# Patient Record
Sex: Male | Born: 1980 | Race: Black or African American | Hispanic: No | Marital: Single | Smoking: Current every day smoker
Health system: Southern US, Community
[De-identification: ages and names within clinical notes are randomized; demographics above are authoritative.]

## PROBLEM LIST (undated history)

## (undated) DIAGNOSIS — S2249XA Multiple fractures of ribs, unspecified side, initial encounter for closed fracture: Secondary | ICD-10-CM

## (undated) DIAGNOSIS — S0230XA Fracture of orbital floor, unspecified side, initial encounter for closed fracture: Secondary | ICD-10-CM

## (undated) DIAGNOSIS — S022XXA Fracture of nasal bones, initial encounter for closed fracture: Secondary | ICD-10-CM

## (undated) DIAGNOSIS — R51 Headache: Secondary | ICD-10-CM

## (undated) DIAGNOSIS — R519 Headache, unspecified: Secondary | ICD-10-CM

## (undated) HISTORY — PX: NO PAST SURGERIES: SHX2092

---

## 2007-01-22 ENCOUNTER — Emergency Department (HOSPITAL_COMMUNITY): Admission: EM | Admit: 2007-01-22 | Discharge: 2007-01-22 | Payer: Self-pay | Admitting: Emergency Medicine

## 2007-02-28 ENCOUNTER — Emergency Department (HOSPITAL_COMMUNITY): Admission: EM | Admit: 2007-02-28 | Discharge: 2007-02-28 | Payer: Self-pay | Admitting: Emergency Medicine

## 2014-08-24 ENCOUNTER — Emergency Department (HOSPITAL_COMMUNITY)
Admission: EM | Admit: 2014-08-24 | Discharge: 2014-08-24 | Disposition: A | Discharge status: Home / Self Care | Payer: Self-pay | Attending: Emergency Medicine | Admitting: Emergency Medicine

## 2014-08-24 ENCOUNTER — Encounter (HOSPITAL_COMMUNITY): Payer: Self-pay | Admitting: Emergency Medicine

## 2014-08-24 DIAGNOSIS — S01112A Laceration without foreign body of left eyelid and periocular area, initial encounter: Secondary | ICD-10-CM | POA: Insufficient documentation

## 2014-08-24 DIAGNOSIS — W01198A Fall on same level from slipping, tripping and stumbling with subsequent striking against other object, initial encounter: Secondary | ICD-10-CM | POA: Insufficient documentation

## 2014-08-24 DIAGNOSIS — Y9389 Activity, other specified: Secondary | ICD-10-CM | POA: Insufficient documentation

## 2014-08-24 DIAGNOSIS — S0181XA Laceration without foreign body of other part of head, initial encounter: Secondary | ICD-10-CM

## 2014-08-24 DIAGNOSIS — Z23 Encounter for immunization: Secondary | ICD-10-CM | POA: Insufficient documentation

## 2014-08-24 DIAGNOSIS — Y92481 Parking lot as the place of occurrence of the external cause: Secondary | ICD-10-CM | POA: Insufficient documentation

## 2014-08-24 DIAGNOSIS — S0081XA Abrasion of other part of head, initial encounter: Secondary | ICD-10-CM

## 2014-08-24 DIAGNOSIS — W19XXXA Unspecified fall, initial encounter: Secondary | ICD-10-CM

## 2014-08-24 MED ORDER — BACITRACIN 500 UNIT/GM EX OINT
1.0000 "application " | TOPICAL_OINTMENT | Freq: Two times a day (BID) | CUTANEOUS | Status: DC
  Filled 2014-08-24 (×2): qty 0.9

## 2014-08-24 MED ORDER — LIDOCAINE HCL (PF) 1 % IJ SOLN
5.0000 mL | Freq: Once | INTRAMUSCULAR | Status: AC
  Administered 2014-08-24: 5 mL via INTRADERMAL
  Filled 2014-08-24: qty 5

## 2014-08-24 MED ORDER — TETANUS-DIPHTH-ACELL PERTUSSIS 5-2.5-18.5 LF-MCG/0.5 IM SUSP
0.5000 mL | Freq: Once | INTRAMUSCULAR | Status: AC
  Administered 2014-08-24: 0.5 mL via INTRAMUSCULAR
  Filled 2014-08-24: qty 0.5

## 2014-08-24 NOTE — ED Provider Notes (Signed)
CSN: [id number]     Arrival date & time 08/24/14  1638 History   First MD Initiated Contact with Patient 08/24/14 1724     Chief Complaint  Patient presents with  . Head Laceration     (Consider location/radiation/quality/duration/timing/severity/associated sxs/prior Treatment) HPI  Vincent Booth is a 33 y.o. male complaining of laceration above left eye status post slip and fall. Patient states he was in a parking lot, playing with his friends son and he tripped and fell hitting his head on the concrete. There was no loss of consciousness. He rates his pain is minimal. Last tetanus shot is unknown. Bleeding is controlled. He denies any change in vision, pain with eye movement.  History reviewed. No pertinent past medical history. History reviewed. No pertinent past surgical history. History reviewed. No pertinent family history. History  Substance Use Topics  . Smoking status: Never Smoker   . Smokeless tobacco: Not on file  . Alcohol Use: Yes    Review of Systems  10 systems reviewed and found to be negative, except as noted in the HPI.   Allergies  Review of patient's allergies indicates no known allergies.  Home Medications   Prior to Admission medications   Not on File   BP 140/74  Pulse 100  Temp(Src) 98.2 F (36.8 C) (Oral)  Resp 16  Ht 6\' 1"  (1.854 m)  Wt 215 lb (97.523 kg)  BMI 28.37 kg/m2  SpO2 100% Physical Exam  Nursing note and vitals reviewed. Constitutional: He is oriented to person, place, and time. He appears well-developed and well-nourished.  HENT:  Head: Normocephalic and atraumatic.    Mouth/Throat: Oropharynx is clear and moist.  No tenderness palpation or crepitance along the orbital rim. Extraocular movements areintact without pain or diplopia  Eyes: Conjunctivae and EOM are normal. Pupils are equal, round, and reactive to light.  Neck: Normal range of motion. Neck supple.  No midline C-spine  tenderness to palpation or step-offs  appreciated. Patient has full range of motion without pain.   Cardiovascular: Normal rate, regular rhythm and intact distal pulses.   Pulmonary/Chest: Effort normal and breath sounds normal. No respiratory distress. He has no wheezes. He has no rales. He exhibits no tenderness.  No TTP or crepitance  Abdominal: Soft. Bowel sounds are normal. He exhibits no distension and no mass. There is no tenderness. There is no rebound and no guarding.  No  Musculoskeletal: Normal range of motion. He exhibits no edema and no tenderness.  Pelvis stable. No deformity or TTP of major joints.   Good ROM  Neurological: He is alert and oriented to person, place, and time.  Strength 5/5 x4 extremities   Distal sensation intact  Skin: Skin is warm.  Psychiatric: He has a normal mood and affect.    ED Course  LACERATION REPAIR Date/Time: 08/24/2014 7:57 PM Performed by: Wynetta EmeryPISCIOTTA, Ivy Meriwether Authorized by: Wynetta EmeryPISCIOTTA, Lorna Strother Consent: Verbal consent obtained. Consent given by: patient Patient identity confirmed: verbally with patient Body area: head/neck Location details: left eyelid Laceration length: 2 cm Foreign bodies: no foreign bodies Tendon involvement: none Vascular damage: no Anesthesia: local infiltration Local anesthetic: lidocaine 1% without epinephrine Anesthetic total: 2 ml Patient sedated: no Preparation: Patient was prepped and draped in the usual sterile fashion. Irrigation solution: saline Irrigation method: syringe Amount of cleaning: extensive Wound skin closure material used: 6-0 ethylon. Number of sutures: 5 Technique: simple Approximation: close Approximation difficulty: simple Dressing: antibiotic ointment and 4x4 sterile gauze Patient tolerance: Patient tolerated the procedure  well with no immediate complications.    LACERATION REPAIR Date/Time: 08/24/2014 7:57 PM Performed by: Wynetta EmeryPISCIOTTA, Manna Gose Authorized by: Wynetta EmeryPISCIOTTA, Zayah Keilman Consent: Verbal consent  obtained. Consent given by: patient Patient identity confirmed: verbally with patient Body area: head/neck Location details: left eyelid Laceration length: 0.5 cm Foreign bodies: no foreign bodies Tendon involvement: none Vascular damage: no Anesthesia: local infiltration Local anesthetic: lidocaine 1% without epinephrine Anesthetic total: 2 ml Patient sedated: no Preparation: Patient was prepped and draped in the usual sterile fashion. Irrigation solution: saline Irrigation method: syringe Amount of cleaning: extensive Wound skin closure material used: 6-0 ethylon. Number of sutures:2 Technique: simple Approximation: close Approximation difficulty: simple Dressing: antibiotic ointment and 4x4 sterile gauze Patient tolerance: Patient tolerated the procedure well with no immediate complications.   Labs Review Labs Reviewed - No data to display  Imaging Review No results found.   EKG Interpretation None      MDM   Final diagnoses:  Facial laceration, initial encounter  Facial abrasion, initial encounter  Fall, initial encounter    Filed Vitals:   08/24/14 1724  BP: 140/74  Pulse: 100  Temp: 98.2 F (36.8 C)  TempSrc: Oral  Resp: 16  Height: 6\' 1"  (1.854 m)  Weight: 215 lb (97.523 kg)  SpO2: 100%    Medications  bacitracin ointment 1 application (not administered)  Tdap (BOOSTRIX) injection 0.5 mL (0.5 mLs Intramuscular Given 08/24/14 1734)  lidocaine (PF) (XYLOCAINE) 1 % injection 5 mL (5 mLs Intradermal Given 08/24/14 1734)    Vincent Booth is a 33 y.o. male presenting with abrasions and left periorbital lacerations, status post slip and fall from standing onto the concrete pavement. He did not lose consciousness, neuro exam is nonfocal. No indication for intracranial imaging. Patient has no tenderness to palpation or crepitance along the orbital rim, full extraocular movement without pain or diplopia, no indication for maxillofacial imaging.  Wounds  are irrigated irrigated and close, tetanus updated. Wound care discussed.  Evaluation does not show pathology that would require ongoing emergent intervention or inpatient treatment. Pt is hemodynamically stable and mentating appropriately. Discussed findings and plan with patient/guardian, who agrees with care plan. All questions answered. Return precautions discussed and outpatient follow up given.       Wynetta Emeryicole Bailen Geffre, PA-C 08/24/14 2000

## 2014-08-24 NOTE — ED Notes (Signed)
Laceration above rt. Eye.  Playing around with friends son. Pt. Fell from the curb. No loc.

## 2014-08-24 NOTE — Discharge Instructions (Signed)
Keep wound dry and do not remove dressing for 24 hours if possible. After that, wash gently morning and night (every 12 hours) with soap and water. Use a topical antibiotic ointment and cover with a bandaid or gauze.    Do NOT use rubbing alcohol or hydrogen peroxide, do not soak the area   Present to your primary care doctor or the urgent care of your choice, or the ED for suture removal in 7 days.   Every attempt was made to remove foreign body (contaminants) from the wound.  However, there is always a chance that some may remain in the wound. This can  increase your risk of infection.   If you see signs of infection (warmth, redness, tenderness, pus, sharp increase in pain, fever, red streaking in the skin) immediately return to the emergency department.   After the wound heals fully, apply sunscreen for 6-12 months to minimize scarring.     Facial Laceration  A facial laceration is a cut on the face. These injuries can be painful and cause bleeding. Lacerations usually heal quickly, but they need special care to reduce scarring. DIAGNOSIS  Your health care provider will take a medical history, ask for details about how the injury occurred, and examine the wound to determine how deep the cut is. TREATMENT  Some facial lacerations may not require closure. Others may not be able to be closed because of an increased risk of infection. The risk of infection and the chance for successful closure will depend on various factors, including the amount of time since the injury occurred. The wound may be cleaned to help prevent infection. If closure is appropriate, pain medicines may be given if needed. Your health care provider will use stitches (sutures), wound glue (adhesive), or skin adhesive strips to repair the laceration. These tools bring the skin edges together to allow for faster healing and a better cosmetic outcome. If needed, you may also be given a tetanus shot. HOME CARE  INSTRUCTIONS  Only take over-the-counter or prescription medicines as directed by your health care provider.  Follow your health care provider's instructions for wound care. These instructions will vary depending on the technique used for closing the wound. For Sutures:  Keep the wound clean and dry.   If you were given a bandage (dressing), you should change it at least once a day. Also change the dressing if it becomes wet or dirty, or as directed by your health care provider.   Wash the wound with soap and water 2 times a day. Rinse the wound off with water to remove all soap. Pat the wound dry with a clean towel.   After cleaning, apply a thin layer of the antibiotic ointment recommended by your health care provider. This will help prevent infection and keep the dressing from sticking.   You may shower as usual after the first 24 hours. Do not soak the wound in water until the sutures are removed.   Get your sutures removed as directed by your health care provider. With facial lacerations, sutures should usually be taken out after 4-5 days to avoid stitch marks.   Wait a few days after your sutures are removed before applying any makeup. For Skin Adhesive Strips:  Keep the wound clean and dry.   Do not get the skin adhesive strips wet. You may bathe carefully, using caution to keep the wound dry.   If the wound gets wet, pat it dry with a clean towel.  Skin adhesive strips will fall off on their own. You may trim the strips as the wound heals. Do not remove skin adhesive strips that are still stuck to the wound. They will fall off in time.  For Wound Adhesive:  You may briefly wet your wound in the shower or bath. Do not soak or scrub the wound. Do not swim. Avoid periods of heavy sweating until the skin adhesive has fallen off on its own. After showering or bathing, gently pat the wound dry with a clean towel.   Do not apply liquid medicine, cream medicine, ointment  medicine, or makeup to your wound while the skin adhesive is in place. This may loosen the film before your wound is healed.   If a dressing is placed over the wound, be careful not to apply tape directly over the skin adhesive. This may cause the adhesive to be pulled off before the wound is healed.   Avoid prolonged exposure to sunlight or tanning lamps while the skin adhesive is in place.  The skin adhesive will usually remain in place for 5-10 days, then naturally fall off the skin. Do not pick at the adhesive film.  After Healing: Once the wound has healed, cover the wound with sunscreen during the day for 1 full year. This can help minimize scarring. Exposure to ultraviolet light in the first year will darken the scar. It can take 1-2 years for the scar to lose its redness and to heal completely.  SEEK IMMEDIATE MEDICAL CARE IF:  You have redness, pain, or swelling around the wound.   You see ayellowish-white fluid (pus) coming from the wound.   You have chills or a fever.  MAKE SURE YOU:  Understand these instructions.  Will watch your condition.  Will get help right away if you are not doing well or get worse. Document Released: 12/07/2004 Document Revised: 08/20/2013 Document Reviewed: 06/12/2013 [facility] Patient Information 2015 [place], [place]. This information is not intended to replace advice given to you by your health care provider. Make sure you discuss any questions you have with your health care provider.  Abrasion An abrasion is a cut or scrape of the skin. Abrasions do not extend through all layers of the skin and most heal within 10 days. It is important to care for your abrasion properly to prevent infection. CAUSES  Most abrasions are caused by falling on, or gliding across, the ground or other surface. When your skin rubs on something, the outer and inner layer of skin rubs off, causing an abrasion. DIAGNOSIS  Your caregiver will be able to diagnose an  abrasion during a physical exam.  TREATMENT  Your treatment depends on how large and deep the abrasion is. Generally, your abrasion will be cleaned with water and a mild soap to remove any dirt or debris. An antibiotic ointment may be put over the abrasion to prevent an infection. A bandage (dressing) may be wrapped around the abrasion to keep it from getting dirty.  You may need a tetanus shot if:  You cannot remember when you had your last tetanus shot.  You have never had a tetanus shot.  The injury broke your skin. If you get a tetanus shot, your arm may swell, get red, and feel warm to the touch. This is common and not a problem. If you need a tetanus shot and you choose not to have one, there is a rare chance of getting tetanus. Sickness from tetanus can be serious.  HOME  CARE INSTRUCTIONS   If a dressing was applied, change it at least once a day or as directed by your caregiver. If the bandage sticks, soak it off with warm water.   Wash the area with water and a mild soap to remove all the ointment 2 times a day. Rinse off the soap and pat the area dry with a clean towel.   Reapply any ointment as directed by your caregiver. This will help prevent infection and keep the bandage from sticking. Use gauze over the wound and under the dressing to help keep the bandage from sticking.   Change your dressing right away if it becomes wet or dirty.   Only take over-the-counter or prescription medicines for pain, discomfort, or fever as directed by your caregiver.   Follow up with your caregiver within 24-48 hours for a wound check, or as directed. If you were not given a wound-check appointment, look closely at your abrasion for redness, swelling, or pus. These are signs of infection. SEEK IMMEDIATE MEDICAL CARE IF:   You have increasing pain in the wound.   You have redness, swelling, or tenderness around the wound.   You have pus coming from the wound.   You have a fever or  persistent symptoms for more than 2-3 days.  You have a fever and your symptoms suddenly get worse.  You have a bad smell coming from the wound or dressing.  MAKE SURE YOU:   Understand these instructions.  Will watch your condition.  Will get help right away if you are not doing well or get worse. Document Released: 08/09/2005 Document Revised: 10/16/2012 Document Reviewed: 10/03/2011 [facility] Patient Information 2015 [place], [place]. This information is not intended to replace advice given to you by your health care provider. Make sure you discuss any questions you have with your health care provider. Emergency Department Resource Guide 1) Find a Doctor and Pay Out of Pocket Although you won't have to find out who is covered by your insurance plan, it is a good idea to ask around and get recommendations. You will then need to call the office and see if the doctor you have chosen will accept you as a new patient and what types of options they offer for patients who are self-pay. Some doctors offer discounts or will set up payment plans for their patients who do not have insurance, but you will need to ask so you aren't surprised when you get to your appointment.  2) Contact Your Local Health Department Not all health departments have doctors that can see patients for sick visits, but many do, so it is worth a call to see if yours does. If you don't know where your local health department is, you can check in your phone book. The CDC also has a tool to help you locate your state's health department, and many state websites also have listings of all of their local health departments.  3) Find a Walk-in Clinic If your illness is not likely to be very severe or complicated, you may want to try a walk in clinic. These are popping up all over the country in pharmacies, drugstores, and shopping centers. They're usually staffed by nurse practitioners or physician assistants that have been trained to  treat common illnesses and complaints. They're usually fairly quick and inexpensive. However, if you have serious medical issues or chronic medical problems, these are probably not your best option.  No Primary Care Doctor: - Call Health Connect at  [phone] - they can help you locate a primary care doctor that  accepts your insurance, provides certain services, etc. - Physician Referral Service- [phone]  Chronic Pain Problems: Organization         Address  Phone   Notes  [facility]  [phone]) [phone] Patients need to be referred by their primary care doctor.   Medication Assistance: Organization         Address  Phone   Notes  [facility]ication [facility] [address] [place], [place] [address] [phone]) [phone] --Must be a resident of [facility] [place] -- Must have NO insurance coverage whatsoever (no Medicaid/ Medicare, etc.) -- The pt. MUST have a primary care doctor that directs their care regularly and follows them in the community   Med[facility]ist  [phone]   [employer]  [phone]    Agencies that provide inexpensive medical care: Organization         Address  Phone   Notes  [facility]  [phone]   [facility]    [phone]) [phone]   [facility] [address] [place], [place] [address] [phone]) [phone]   [facility] [place] [address], [place] [phone]   [facility]anned Parenthood    [phone]   [facility]    [phone]) [phone]   [facility]  [address], [place] Phone:  [phone], Fax:  [phone] Hours of Operation:  9 am - 6 pm, M-F.  Also accepts Medicaid/Medicare and self-pay.  [facility]  [address], [place] Phone: [phone]) [phone], Fax: [phone]) [phone]. Hours of Operation:  8:30 am - 5:30 pm, M-F.  Also accepts Medicaid and self-pay.  [facility]  [place] [address], [place] Phone: [phone]   [facility] [address], [place], [place] [phone], Ext. [phone] Mondays & Thursdays: 7-9 AM.  First 15 patients are seen on a first come, first serve basis.    Medicaid-accepting [facility] Providers:  Organization         Address  Phone   Notes  [facility] [address], [place] [phone] Also accepts self-pay patients.  [facility] [address], [place]  [phone]) [phone]   [facility] [address], [place] [phone]) [phone]   [facility] [address] [place] [address], [place] [phone]) [phone]   Renaye RakersVeita Bland [address], [place]   [phone] Only accepts [place] Access [place] patients after they have their name applied to their card.   Self-Pay (no insurance) in [facility] [place]:  Organization         Address  Phone   Notes  Sickle Cell Patients, [facility] [address], [place] [phone]) [phone]   [facility] [address], [place] [phone]   [facility]  [address], [place] [phone]   [facility]/Dr. Osei-Bonsu  [address] [place] [address], [place] or [phone], [place] [phone] Phone number for both [place] and [place] locations is the same.  [facility] [address], [place] [phone]) [phone]   [facility] [place] [address] [place] [address], [place] or  [address] [phone]   [facility] [address] [place] [phone], phone; [phone], fax Sees patients 1st and 3rd Saturday of every month.  Must not qualify for public or private insurance (i.e. Medicaid, Medicare, [place] Health Choice, Veterans' Benefits)  Household income should be no more than 200%  of the poverty level The clinic cannot treat you if you are pregnant or think you are pregnant  Sexually transmitted diseases are not treated at the clinic.    Dental Care: Organization         Address  Phone  Notes  [facility] of [facility] [address] [place], [place] [phone] Accepts children up to age 78 who are enrolled in [place] or [place] Health Choice; pregnant women with a Medicaid card; and children who have applied for Medicaid or [place] Health Choice, but were declined, whose parents can pay a reduced fee at time of service.  [facility]  [address], [place] [phone] Accepts children up to age 12 who are enrolled in [place] or [place] Health Choice; pregnant women with a Medicaid card; and children who have applied for Medicaid or [place] Health Choice, but were declined, whose parents can pay a reduced fee at time of service.  [facility]  [address] [place], [place] [phone] Patients are seen by appointment only. Walk-ins are not accepted. [facility] will see patients 32 years of age and older. Monday - Tuesday (8am-5pm) Most Wednesdays (8:30-5pm) $30 per visit, cash only  [facility]  [address], [facility] [phone] Patients are seen by appointment only. Walk-ins are not accepted. [facility] will see patients 73 years of age and older. One Wednesday Evening (Monthly: Volunteer Based).  $30 per visit, cash only  [employer]  [phone] for adults; Children under age 29, call Graduate Pediatric Dentistry at [phone]. Children aged 91-14, please call [phone] to request a pediatric application.  Dental services are provided in all areas of dental care including fillings, crowns and bridges, complete and partial dentures, implants, gum treatment, root canals, and  extractions. Preventive care is also provided. Treatment is provided to both adults and children. Patients are selected via a lottery and there is often a waiting list.   [facility] [address] [place]  [phone] [email or web address]   [facility] [address] [place], [place] [phone], Ext. [phone] Second and Fourth Thursday of each month, opens at 6:30 AM; Clinic ends at 9 AM.  Patients are seen on a first-come first-served basis, and a limited number are seen during each clinic.   [facility]  [address] [place], [place] [phone]   Eligibility Requirements You must have lived in [place], [place], or [place] counties for at least the last three months.   You cannot be eligible for state or federal sponsored [place], including [employer], [place], or [employer].   You generally cannot be eligible for healthcare insurance through your employer.    How to apply: Eligibility screenings are held every Tuesday and Wednesday afternoon from 1:00 pm until 4:00 pm. You do not need an appointment for the interview!  [facility] [address] [place], [place] [phone]   [facility]  [phone]   [email or web address]  [employer] Health Department  [phone]   [facility] Department  [phone]    Behavioral Health Resources in the Community: Intensive Outpatient Programs Organization         Address  Phone  Notes  [facility] Services [address], [place], [place] [phone]   [facility] [address], [place], [place] [phone]   [facility] [address], [place], [place]  [phone]   [facility] [address],  [place], [place] [phone] or [phone]   Substance Abuse Resources Organization         Address  Phone  Notes  Alcohol [facility] Drug Services  [phone]    [facility]  [phone]   [facility] [place]  [phone]   Floydene Flock  [phone]   Residential & Outpatient Substance Abuse Program  [phone]   Psychological Services Organization         Address  Phone  Notes  [facility] Behavior[facility] Health  [phone]   [facility] Services  [phone]   [facility] [address], [place] [phone] or [phone]    Mobile Crisis Teams Organization         Address  Phone  Notes  [facility], Mobile Crisis Care Unit  [phone]   [facility]  [address] [place], [place] [phone]   Doristine Locks [address] [place] [phone]    Self-Help/Support Groups Organization         Address  Phone             Notes  [facility]ental [facility] Assoc. of [place] - variety of support groups  [phone]- I7437963 Call for more information  Narcotics Anonymous (NA), [facility]aring [facility] [address], [employer] [place]  2 meetings at this location   Statistician         Address  Phone  Notes  [facility] [address],    [place]  [phone]   [facility]  [address] [place] 6[address]195, [place], [place] [phone]   [facility] [address] [place], [place] [phone] Admissions: 8am-3pm M-F  [facility] [address],    [place], [place] [phone]   [facility] [address] [place], [place], [place] [phone]   [facility] [address]  [place], [place] [phone]   [facility] - Intensive Outpatient [address], [place], [place] [phone]   [facility] (Addiction Recovery [facility] [address] [place]  [place], [place] [phone] or [phone]   [facility] ([facility]) [address] [place], [place] [phone] Accepts Medicaid  [facility] [place] [address]   [place] [phone] Substance Abuse/Addiction Treatment   [facility] Organization         Address  Phone  Notes  [facility]  [phone]   Angie Fava, PhD [address] [place], [place]   [phone] or [phone]   [facility] Behavioral   [address] [place], [place] [phone]   [facility] [address], [place], [place] [phone] Insurance/Medicaid/sponsorship through [employer] and Families [address]  [place], [place] [phone] [place], [place] [phone]    Dr. Adele Schilder  [phone]   [facility] [place] Dept. 1) [address], [place] 2) [place] 3)  [place] [address], [place] [phone]  [phone]   [place] [phone] or [phone] (After Hours)

## 2014-08-24 NOTE — ED Provider Notes (Signed)
Medical screening examination/treatment/procedure(s) were performed by non-physician practitioner and as supervising physician I was immediately available for consultation/collaboration.   EKG Interpretation None        Gwyneth SproutWhitney Cranston Koors, MD 08/24/14 2315

## 2014-08-31 ENCOUNTER — Emergency Department (HOSPITAL_COMMUNITY)
Admission: EM | Admit: 2014-08-31 | Discharge: 2014-08-31 | Disposition: A | Discharge status: Home / Self Care | Payer: Self-pay | Attending: Emergency Medicine | Admitting: Emergency Medicine

## 2014-08-31 ENCOUNTER — Encounter (HOSPITAL_COMMUNITY): Payer: Self-pay | Admitting: Emergency Medicine

## 2014-08-31 DIAGNOSIS — Z4802 Encounter for removal of sutures: Secondary | ICD-10-CM | POA: Insufficient documentation

## 2014-08-31 NOTE — Discharge Planning (Signed)
[facility] Community Health & Eligibility Specialist  Spoke to patient regarding follow up care and primary care resources. Resource guide and my contact information provided for any future questions or concerns. No other Community Health & Eligibility Specialist needs identified at this time.

## 2014-08-31 NOTE — ED Notes (Signed)
Pager [phone]

## 2014-08-31 NOTE — ED Notes (Signed)
Pt here for suture removal to right eye area

## 2014-08-31 NOTE — Discharge Instructions (Signed)

## 2014-08-31 NOTE — ED Provider Notes (Signed)
CSN: [id number]     Arrival date & time 08/31/14  1301 History  This chart was scribed for non-physician practitioner, Fayrene HelperBowie Mikel Hardgrove, PA-C, working with Samuel JesterKathleen McManus, DO by Charline BillsEssence Howell, ED Scribe. This patient was seen in room TR04C/TR04C and the patient's care was started at 2:06 PM.   Chief Complaint  Patient presents with  . Suture / Staple Removal   The history is provided by the patient. No language interpreter was used.   HPI Comments: Vincent ItoDavid Booth is a 33 y.o. male who presents to the Emergency Department for suture removal. Pt had 7 sutures placed above R eye following a fall on 08/24/14. He denies any complications at this time. Pain has improved, no vision changes, no signs of infection.  History reviewed. No pertinent past medical history. History reviewed. No pertinent past surgical history. History reviewed. No pertinent family history. History  Substance Use Topics  . Smoking status: Never Smoker   . Smokeless tobacco: Not on file  . Alcohol Use: Yes    Review of Systems  Eyes: Negative for visual disturbance.  Skin: Positive for wound.   Allergies  Review of patient's allergies indicates no known allergies.  Home Medications   Prior to Admission medications   Not on File   Triage Vitals: BP 146/94  Pulse 96  Temp(Src) 97.7 F (36.5 C) (Oral)  Resp 18  SpO2 100% Physical Exam  Nursing note and vitals reviewed. Constitutional: He is oriented to person, place, and time. He appears well-developed and well-nourished. No distress.  HENT:  Head: Normocephalic and atraumatic.  R forehead: Well healing laceration with sutures intact  Minimal tenderness to palpation Mild surrounding edema R eye with subconjunctival hemorrhage Skin abrasion to R temp  Eyes: Conjunctivae and EOM are normal.  Neck: Neck supple. No tracheal deviation present.  Pulmonary/Chest: Effort normal. No respiratory distress.  Musculoskeletal: Normal range of motion.  Neurological:  He is alert and oriented to person, place, and time.  Skin: Skin is warm and dry.  Psychiatric: He has a normal mood and affect. His behavior is normal.   ED Course  Procedures (including critical care time) DIAGNOSTIC STUDIES: Oxygen Saturation is 100% on RA, normal by my interpretation.    COORDINATION OF CARE: 2:09 PM- Sutures were removed. Discussed wound care and OTC scar reducing cream with pt at bedside and pt agreed to plan.   SUTURE REMOVAL Performed by: Fayrene HelperRAN,Omeed Osuna  Consent: Verbal consent obtained. Patient identity confirmed: provided demographic data Time out: Immediately prior to procedure a "time out" was called to verify the correct patient, procedure, equipment, support staff and site/side marked as required.  Location details: R forehead  Wound Appearance: clean  Sutures/Staples Removed: 6  Facility: sutures placed in this facility Patient tolerance: Patient tolerated the procedure well with no immediate complications.     Labs Review Labs Reviewed - No data to display  Imaging Review No results found.   EKG Interpretation None      MDM   Final diagnoses:  Encounter for removal of sutures    BP 146/94  Pulse 96  Temp(Src) 97.7 F (36.5 C) (Oral)  Resp 18  SpO2 100%   I personally performed the services described in this documentation, which was scribed in my presence. The recorded information has been reviewed and is accurate.    Fayrene HelperBowie Rajon Bisig, PA-C 08/31/14 1423

## 2014-09-01 NOTE — ED Provider Notes (Signed)
Medical screening examination/treatment/procedure(s) were performed by non-physician practitioner and as supervising physician I was immediately available for consultation/collaboration.   EKG Interpretation None        Linzie Boursiquot, DO 09/01/14 2314 

## 2017-05-21 ENCOUNTER — Emergency Department (HOSPITAL_COMMUNITY): Payer: Self-pay

## 2017-05-21 ENCOUNTER — Observation Stay (HOSPITAL_COMMUNITY)
Admission: EM | Admit: 2017-05-21 | Discharge: 2017-05-23 | Disposition: A | Discharge status: Home / Self Care | Payer: Self-pay | Attending: General Surgery | Admitting: General Surgery

## 2017-05-21 ENCOUNTER — Encounter (HOSPITAL_COMMUNITY): Payer: Self-pay | Admitting: Vascular Surgery

## 2017-05-21 DIAGNOSIS — J342 Deviated nasal septum: Secondary | ICD-10-CM | POA: Insufficient documentation

## 2017-05-21 DIAGNOSIS — S27329A Contusion of lung, unspecified, initial encounter: Secondary | ICD-10-CM

## 2017-05-21 DIAGNOSIS — S0181XA Laceration without foreign body of other part of head, initial encounter: Secondary | ICD-10-CM | POA: Insufficient documentation

## 2017-05-21 DIAGNOSIS — S0230XA Fracture of orbital floor, unspecified side, initial encounter for closed fracture: Secondary | ICD-10-CM

## 2017-05-21 DIAGNOSIS — H53149 Visual discomfort, unspecified: Secondary | ICD-10-CM

## 2017-05-21 DIAGNOSIS — Z79899 Other long term (current) drug therapy: Secondary | ICD-10-CM | POA: Insufficient documentation

## 2017-05-21 DIAGNOSIS — S0232XA Fracture of orbital floor, left side, initial encounter for closed fracture: Principal | ICD-10-CM | POA: Insufficient documentation

## 2017-05-21 DIAGNOSIS — S2249XA Multiple fractures of ribs, unspecified side, initial encounter for closed fracture: Secondary | ICD-10-CM

## 2017-05-21 DIAGNOSIS — R42 Dizziness and giddiness: Secondary | ICD-10-CM | POA: Insufficient documentation

## 2017-05-21 DIAGNOSIS — Y939 Activity, unspecified: Secondary | ICD-10-CM | POA: Insufficient documentation

## 2017-05-21 DIAGNOSIS — F17290 Nicotine dependence, other tobacco product, uncomplicated: Secondary | ICD-10-CM | POA: Insufficient documentation

## 2017-05-21 DIAGNOSIS — M25552 Pain in left hip: Secondary | ICD-10-CM | POA: Insufficient documentation

## 2017-05-21 DIAGNOSIS — S2241XA Multiple fractures of ribs, right side, initial encounter for closed fracture: Secondary | ICD-10-CM | POA: Insufficient documentation

## 2017-05-21 DIAGNOSIS — S27321A Contusion of lung, unilateral, initial encounter: Secondary | ICD-10-CM | POA: Insufficient documentation

## 2017-05-21 DIAGNOSIS — S0240DA Maxillary fracture, left side, initial encounter for closed fracture: Secondary | ICD-10-CM | POA: Insufficient documentation

## 2017-05-21 DIAGNOSIS — S022XXA Fracture of nasal bones, initial encounter for closed fracture: Secondary | ICD-10-CM | POA: Insufficient documentation

## 2017-05-21 DIAGNOSIS — Y92410 Unspecified street and highway as the place of occurrence of the external cause: Secondary | ICD-10-CM | POA: Insufficient documentation

## 2017-05-21 HISTORY — DX: Headache: R51

## 2017-05-21 HISTORY — DX: Multiple fractures of ribs, unspecified side, initial encounter for closed fracture: S22.49XA

## 2017-05-21 HISTORY — DX: Fracture of orbital floor, unspecified side, initial encounter for closed fracture: S02.30XA

## 2017-05-21 HISTORY — DX: Fracture of nasal bones, initial encounter for closed fracture: S02.2XXA

## 2017-05-21 HISTORY — DX: Headache, unspecified: R51.9

## 2017-05-21 LAB — COMPREHENSIVE METABOLIC PANEL
ALBUMIN: 3.8 g/dL (ref 3.5–5.0)
ALT: 23 U/L (ref 17–63)
AST: 40 U/L (ref 15–41)
Alkaline Phosphatase: 45 U/L (ref 38–126)
Anion gap: 6 (ref 5–15)
BUN: 11 mg/dL (ref 6–20)
CHLORIDE: 106 mmol/L (ref 101–111)
CO2: 26 mmol/L (ref 22–32)
Calcium: 8.7 mg/dL — ABNORMAL LOW (ref 8.9–10.3)
Creatinine, Ser: 1.25 mg/dL — ABNORMAL HIGH (ref 0.61–1.24)
GFR calc Af Amer: >60 mL/min (ref >60)
Glucose, Bld: 113 mg/dL — ABNORMAL HIGH (ref 65–99)
POTASSIUM: 3.9 mmol/L (ref 3.5–5.1)
SODIUM: 138 mmol/L (ref 135–145)
Total Bilirubin: 0.9 mg/dL (ref 0.3–1.2)
Total Protein: 6.1 g/dL — ABNORMAL LOW (ref 6.5–8.1)

## 2017-05-21 LAB — CBC WITH DIFFERENTIAL/PLATELET
BASOS ABS: 0 10*3/uL (ref 0.0–0.1)
BASOS PCT: 1 %
EOS ABS: 0.1 10*3/uL (ref 0.0–0.7)
EOS PCT: 1 %
HCT: 49.2 % (ref 39.0–52.0)
Hemoglobin: 17.5 g/dL — ABNORMAL HIGH (ref 13.0–17.0)
Lymphocytes Relative: 22 %
Lymphs Abs: 1.5 10*3/uL (ref 0.7–4.0)
MCH: 31.6 pg (ref 26.0–34.0)
MCHC: 35.6 g/dL (ref 30.0–36.0)
MCV: 89 fL (ref 78.0–100.0)
MONO ABS: 0.7 10*3/uL (ref 0.1–1.0)
Monocytes Relative: 11 %
Neutro Abs: 4.4 10*3/uL (ref 1.7–7.7)
Neutrophils Relative %: 65 %
PLATELETS: 150 10*3/uL (ref 150–400)
RBC: 5.53 MIL/uL (ref 4.22–5.81)
RDW: 13.5 % (ref 11.5–15.5)
WBC: 6.6 10*3/uL (ref 4.0–10.5)

## 2017-05-21 LAB — I-STAT CHEM 8, ED
BUN: 12 mg/dL (ref 6–20)
CALCIUM ION: 1.13 mmol/L — AB (ref 1.15–1.40)
CHLORIDE: 102 mmol/L (ref 101–111)
Creatinine, Ser: 1.1 mg/dL (ref 0.61–1.24)
Glucose, Bld: 112 mg/dL — ABNORMAL HIGH (ref 65–99)
HEMATOCRIT: 51 % (ref 39.0–52.0)
Hemoglobin: 17.3 g/dL — ABNORMAL HIGH (ref 13.0–17.0)
Potassium: 3.8 mmol/L (ref 3.5–5.1)
SODIUM: 140 mmol/L (ref 135–145)
TCO2: 27 mmol/L (ref 0–100)

## 2017-05-21 LAB — SAMPLE TO BLOOD BANK

## 2017-05-21 LAB — I-STAT CG4 LACTIC ACID, ED: Lactic Acid, Venous: 1.39 mmol/L (ref 0.5–1.9)

## 2017-05-21 LAB — I-STAT TROPONIN, ED: Troponin i, poc: 0 ng/mL (ref 0.00–0.08)

## 2017-05-21 MED ORDER — SODIUM CHLORIDE 0.9 % IV SOLN
INTRAVENOUS | Status: DC
  Administered 2017-05-21 – 2017-05-22 (×2): via INTRAVENOUS

## 2017-05-21 MED ORDER — ACETAMINOPHEN 500 MG PO TABS
1000.0000 mg | ORAL_TABLET | Freq: Once | ORAL | Status: AC
  Administered 2017-05-21: 1000 mg via ORAL
  Filled 2017-05-21: qty 2

## 2017-05-21 MED ORDER — LIDOCAINE-EPINEPHRINE (PF) 2 %-1:200000 IJ SOLN
10.0000 mL | Freq: Once | INTRAMUSCULAR | Status: AC
  Administered 2017-05-21: 10 mL via INTRADERMAL
  Filled 2017-05-21: qty 20

## 2017-05-21 MED ORDER — HYDRALAZINE HCL 20 MG/ML IJ SOLN: 10.0000 mg | INTRAMUSCULAR | Status: DC | PRN

## 2017-05-21 MED ORDER — LIDOCAINE HCL (PF) 1 % IJ SOLN
2.0000 mL | Freq: Once | INTRAMUSCULAR | Status: AC
  Administered 2017-05-21: 2 mL via INTRADERMAL
  Filled 2017-05-21: qty 5

## 2017-05-21 MED ORDER — MORPHINE SULFATE (PF) 4 MG/ML IV SOLN: 2.0000 mg | INTRAVENOUS | Status: DC | PRN

## 2017-05-21 MED ORDER — IBUPROFEN 400 MG PO TABS
400.0000 mg | ORAL_TABLET | Freq: Once | ORAL | Status: AC
  Administered 2017-05-21: 400 mg via ORAL
  Filled 2017-05-21: qty 1

## 2017-05-21 MED ORDER — ONDANSETRON 4 MG PO TBDP: 4.0000 mg | ORAL_TABLET | Freq: Four times a day (QID) | ORAL | Status: DC | PRN

## 2017-05-21 MED ORDER — PANTOPRAZOLE SODIUM 40 MG IV SOLR
40.0000 mg | Freq: Every day | INTRAVENOUS | Status: DC
  Administered 2017-05-21: 40 mg via INTRAVENOUS
  Filled 2017-05-21: qty 40

## 2017-05-21 MED ORDER — CYCLOBENZAPRINE HCL 10 MG PO TABS
10.0000 mg | ORAL_TABLET | Freq: Once | ORAL | Status: AC
  Administered 2017-05-21: 10 mg via ORAL
  Filled 2017-05-21: qty 1

## 2017-05-21 MED ORDER — PANTOPRAZOLE SODIUM 40 MG PO TBEC
40.0000 mg | DELAYED_RELEASE_TABLET | Freq: Every day | ORAL | Status: DC
  Administered 2017-05-22 – 2017-05-23 (×2): 40 mg via ORAL
  Filled 2017-05-21 (×2): qty 1

## 2017-05-21 MED ORDER — ONDANSETRON HCL 4 MG/2ML IJ SOLN: 4.0000 mg | Freq: Four times a day (QID) | INTRAMUSCULAR | Status: DC | PRN

## 2017-05-21 MED ORDER — DOCUSATE SODIUM 100 MG PO CAPS
100.0000 mg | ORAL_CAPSULE | Freq: Two times a day (BID) | ORAL | Status: DC
  Administered 2017-05-22 – 2017-05-23 (×3): 100 mg via ORAL
  Filled 2017-05-21 (×4): qty 1

## 2017-05-21 MED ORDER — SODIUM CHLORIDE 0.9 % IV BOLUS (SEPSIS)
20.0000 mL/kg | Freq: Once | INTRAVENOUS | Status: AC
  Administered 2017-05-21: 1860 mL via INTRAVENOUS

## 2017-05-21 MED ORDER — IOPAMIDOL (ISOVUE-300) INJECTION 61%
INTRAVENOUS | Status: AC
  Administered 2017-05-21: 100 mL
  Filled 2017-05-21: qty 100

## 2017-05-21 MED ORDER — OXYCODONE HCL 5 MG PO TABS
5.0000 mg | ORAL_TABLET | ORAL | Status: DC | PRN
  Administered 2017-05-21 – 2017-05-22 (×5): 10 mg via ORAL
  Filled 2017-05-21 (×5): qty 2

## 2017-05-21 NOTE — ED Notes (Signed)
Pt assisted with urinal
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
## 2017-05-21 NOTE — ED Notes (Signed)
Pt provided with urinal
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
## 2017-05-21 NOTE — ED Provider Notes (Signed)
LACERATION REPAIR Performed by: Vincent Booth Mayelin Panos Authorized by: Vincent Booth Consent: Verbal consent obtained. Risks and benefits: risks, benefits and alternatives were discussed Consent given by: patient Patient identity confirmed: provided demographic data Prepped and Draped in normal sterile fashion Wound explored  Laceration Location: right eyebrow   Laceration Length: 4 cm  No Foreign Bodies seen or palpated  Anesthesia: local infiltration  Local anesthetic: lidocaine 1 %  Anesthetic total: 4 ml  Irrigation method: syringe Amount of cleaning: standard  Skin closure: primary   Number of sutures: 7  Technique: simple interrupted  Patient tolerance: Patient tolerated the procedure well with no immediate complications.   LACERATION REPAIR Performed by: Vincent Booth Authorized by: Vincent Booth Consent: Verbal consent obtained. Risks and benefits: risks, benefits and alternatives were discussed Consent given by: patient Patient identity confirmed: provided demographic data Prepped and Draped in normal sterile fashion Wound explored  Laceration Location: forehead  Laceration Length: 5 cm  No Foreign Bodies seen or palpated  Anesthesia: local infiltration  Local anesthetic: lidocaine 1%  Anesthetic total: 4 ml  Irrigation method: syringe Amount of cleaning: standard  Skin closure: primary  Number of sutures: 7  Technique: simple interrupted   Patient tolerance: Patient tolerated the procedure well with no immediate complications.     Vincent Booth, Vincent Warr J, PA-C 05/21/17 1257    Vincent Booth, Erin, Vincent Booth Vincent Booth18 1300
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
## 2017-05-22 ENCOUNTER — Observation Stay (HOSPITAL_COMMUNITY): Payer: Self-pay

## 2017-05-22 ENCOUNTER — Encounter (HOSPITAL_COMMUNITY): Payer: Self-pay | Admitting: General Practice

## 2017-05-22 LAB — BASIC METABOLIC PANEL
ANION GAP: 8 (ref 5–15)
BUN: 8 mg/dL (ref 6–20)
CO2: 23 mmol/L (ref 22–32)
Calcium: 8.3 mg/dL — ABNORMAL LOW (ref 8.9–10.3)
Chloride: 108 mmol/L (ref 101–111)
Creatinine, Ser: 1.22 mg/dL (ref 0.61–1.24)
GFR calc Af Amer: >60 mL/min (ref >60)
GFR calc non Af Amer: >60 mL/min (ref >60)
GLUCOSE: 112 mg/dL — AB (ref 65–99)
POTASSIUM: 4 mmol/L (ref 3.5–5.1)
Sodium: 139 mmol/L (ref 135–145)

## 2017-05-22 LAB — CBC
HEMATOCRIT: 46.6 % (ref 39.0–52.0)
HEMOGLOBIN: 16 g/dL (ref 13.0–17.0)
MCH: 31.1 pg (ref 26.0–34.0)
MCHC: 34.3 g/dL (ref 30.0–36.0)
MCV: 90.5 fL (ref 78.0–100.0)
Platelets: 131 10*3/uL — ABNORMAL LOW (ref 150–400)
RBC: 5.15 MIL/uL (ref 4.22–5.81)
RDW: 13.8 % (ref 11.5–15.5)
WBC: 5.3 10*3/uL (ref 4.0–10.5)

## 2017-05-22 LAB — HIV ANTIBODY (ROUTINE TESTING W REFLEX): HIV SCREEN 4TH GENERATION: NONREACTIVE

## 2017-05-22 MED ORDER — BACITRACIN ZINC 500 UNIT/GM EX OINT
TOPICAL_OINTMENT | Freq: Two times a day (BID) | CUTANEOUS | Status: DC
  Administered 2017-05-22 – 2017-05-23 (×3): via TOPICAL
  Filled 2017-05-22: qty 28.35

## 2017-05-22 MED ORDER — POLYETHYLENE GLYCOL 3350 17 G PO PACK
17.0000 g | PACK | Freq: Every day | ORAL | Status: DC
  Filled 2017-05-22: qty 1

## 2017-05-22 MED ORDER — DIPHENHYDRAMINE HCL 25 MG PO CAPS
25.0000 mg | ORAL_CAPSULE | Freq: Four times a day (QID) | ORAL | Status: DC | PRN
  Administered 2017-05-22 (×2): 25 mg via ORAL
  Filled 2017-05-22 (×3): qty 1
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
## 2017-05-23 MED ORDER — TRAMADOL HCL 50 MG PO TABS: 50.0000 mg | ORAL_TABLET | Freq: Four times a day (QID) | ORAL | 0 refills | Status: AC | PRN

## 2017-05-23 MED ORDER — OXYCODONE HCL 5 MG PO TABS: 5.0000 mg | ORAL_TABLET | ORAL | 0 refills | Status: AC | PRN

## 2017-05-23 MED ORDER — TRAMADOL HCL 50 MG PO TABS
50.0000 mg | ORAL_TABLET | Freq: Four times a day (QID) | ORAL | Status: DC
  Administered 2017-05-23: 50 mg via ORAL
  Filled 2017-05-23: qty 1

## 2017-05-23 NOTE — Progress Notes (Signed)
Patient discharged to home with instructions and prescriptions, cane and 3 in 1.

## 2017-05-23 NOTE — Discharge Instructions (Signed)
Continue to use your incentive spirometer at home. Call us or go to the ER if you experience worsening chest pain, shortness of breath, cough or fevers.   1. PAIN CONTROL:  1. Pain is best controlled by a usual combination of three different methods TOGETHER:  1. Ice/Heat 2. Over the counter pain medication 3. Prescription pain medication 2. Most patients will experience some swelling and bruising. Ice packs or heating pads (30-60 minutes up to 6 times a day) will help. Use ice for the first few days to help decrease swelling and bruising, then switch to heat to help relax tight/sore spots and speed recovery. Some people prefer to use ice alone, heat alone, alternating between ice & heat. Experiment to what works for you. Swelling and bruising can take several weeks to resolve.  3. It is helpful to take an over-the-counter pain medication regularly for the first few weeks. Choose one of the following that works best for you:  1. Naproxen (Aleve, etc) Two 220mg  tabs twice a day 2. Ibuprofen (Advil, etc) Three 200mg  tabs four times a day (every meal & bedtime) 3. Acetaminophen (Tylenol, etc) 500-650mg  four times a day (every meal & bedtime) 4. A prescription for pain medication (such as oxycodone, hydrocodone, etc) should be given to you upon discharge. Take your pain medication as prescribed.  1. If you are having problems/concerns with the prescription medicine (does not control pain, nausea, vomiting, rash, itching, etc), please call us [phone] to see if we need to switch you to a different pain medicine that will work better for you and/or control your side effect better. 2. If you need a refill on your pain medication, please contact your pharmacy. They will contact our office to request authorization. Prescriptions will not be filled after 5 pm or on week-ends. 4. Avoid getting constipated. When taking pain medications, it is common to experience some constipation. Increasing fluid intake  and taking a fiber supplement (such as Metamucil, Citrucel, FiberCon, MiraLax, etc) 1-2 times a day regularly will usually help prevent this problem from occurring. A mild laxative (prune juice, Milk of Magnesia, MiraLax, etc) should be taken according to package directions if there are no bowel movements after 48 hours.  5. Watch out for diarrhea. If you have many loose bowel movements, simplify your diet to bland foods & liquids for a few days. Stop any stool softeners and decrease your fiber supplement. Switching to mild anti-diarrheal medications (Kayopectate, Pepto Bismol) can help. If this worsens or does not improve, please call us. 6. Wash / shower every day. You may shower daily and replace your bandges as needed after showering. Use bacitracin on your facial wounds 2 times daily and avoid getting it in your eyes.  No bathing or submerging your wounds in water until they heal. 7. FOLLOW UP in our office  1. Please call [facility] at [phone] to set up an appointment for a follow-up appointment approximately 2-3 weeks after discharge for wound check  WHEN TO CALL US [phone]:  1. Poor pain control 2. Reactions / problems with new medications (rash/itching, nausea, etc)  3. Fever over 101.5 F (38.5 C) 4. Worsening swelling or bruising 5. Continued bleeding from wounds. 6. Increased pain, redness, or drainage from the wounds which could be signs of infection  The clinic staff is available to answer your questions during regular business hours (8:30am-5pm). Please dont hesitate to call and ask to speak to one of our nurses for clinical concerns.  If you have a medical emergency, go to the nearest emergency room or call 911.  A surgeon from [facility] is always on call at the [facility], [place]  [address], [address], [place], [place] [address] ?  MAIN: [phone] ? TOLL FREE: [phone] ?  FAX [phone]    [email or web address]    Rib Fracture A rib fracture is a break or crack in one of the bones of the ribs. The ribs are a group of long, curved bones that wrap around your chest and attach to your spine. They protect your lungs and other organs in the chest cavity. A broken or cracked rib is often painful, but most do not cause other problems. Most rib fractures heal on their own over time. However, rib fractures can be more serious if multiple ribs are broken or if broken ribs move out of place and push against other structures. What are the causes?  A direct blow to the chest. For example, this could happen during contact sports, a car accident, or a fall against a hard object.  Repetitive movements with high force, such as pitching a baseball or having severe coughing spells. What are the signs or symptoms?  Pain when you breathe in or cough.  Pain when someone presses on the injured area. How is this diagnosed? Your caregiver will perform a physical exam. Various imaging tests may be ordered to confirm the diagnosis and to look for related injuries. These tests may include a chest X-ray, computed tomography (CT), magnetic resonance imaging (MRI), or a bone scan. How is this treated? Rib fractures usually heal on their own in 1-3 months. The longer healing period is often associated with a continued cough or other aggravating activities. During the healing period, pain control is very important. Medication is usually given to control pain. Hospitalization or surgery may be needed for more severe injuries, such as those in which multiple ribs are broken or the ribs have moved out of place. Follow these instructions at home:  Avoid strenuous activity and any activities or movements that cause pain. Be careful during activities and avoid bumping the injured rib.  Gradually increase activity as directed by your caregiver.  Only take over-the-counter or prescription medications as  directed by your caregiver. Do not take other medications without asking your caregiver first.  Apply ice to the injured area for the first 1-2 days after you have been treated or as directed by your caregiver. Applying ice helps to reduce inflammation and pain. ? Put ice in a plastic bag. ? Place a towel between your skin and the bag. ? Leave the ice on for 15-20 minutes at a time, every 2 hours while you are awake.  Perform deep breathing as directed by your caregiver. This will help prevent pneumonia, which is a common complication of a broken rib. Your caregiver may instruct you to: ? Take deep breaths several times a day. ? Try to cough several times a day, holding a pillow against the injured area. ? Use a device called an incentive spirometer to practice deep breathing several times a day.  Drink enough fluids to keep your urine clear or pale yellow. This will help you avoid constipation.  Do not wear a rib belt or binder. These restrict breathing, which can lead to pneumonia. Get help right away if:  You have a fever.  You have difficulty breathing or shortness of breath.  You develop a continual  cough, or you cough up thick or bloody sputum.  You feel sick to your stomach (nausea), throw up (vomit), or have abdominal pain.  You have worsening pain not controlled with medications. This information is not intended to replace advice given to you by your health care provider. Make sure you discuss any questions you have with your health care provider. Document Released: 10/30/2005 Document Revised: 04/06/2016 Document Reviewed: 01/01/2013 Elsevier Interactive Patient Education  2018 [employer]   Facial Laceration A facial laceration is a cut on the face. The injuries can be painful and can cause bleeding. Lacerations usually heal quickly, but they need special care to reduce scarring. How is this diagnosed? This condition is diagnosed by:  Medical history. Your health  care provider will ask for details about how the injury occurred.  Physical exam. Your health care provider will examine the wound to determine how deep the cut is.  How is this treated? Treatment for this condition depends on the severity of the cut, including the risk of infection and how deep the wound is.  The wound will be cleaned to prevent infection.  Your health care provider will decide whether to close the wound. Whether the wound will be closed will depend on: ? The risk of infection. If there is an increased risk of infection, the wound will not be closed. ? The amount of time since the injury occurred. The chance of a successful closure will depend on how old the wound is.  If closure is appropriate: ? Your health care provider will use stitches (sutures), wound glue (adhesive), or skin adhesive strips to repair the laceration. ? These tools will bring the skin edges together to allow for faster healing and a better cosmetic outcome.  You may be given pain medicine.  If needed, you may also be given a tetanus shot.  Follow these instructions at home:  Take over-the-counter and prescription medicines only as told by your health care provider.  Use bacitracin on your wounds 2 times a day and avoid contact with your eyes For sutures:  Keep the wound clean and dry.  If you were given a bandage (dressing), you should change it at least once a day. Also change the dressing if it becomes wet or dirty, or as directed by your health care provider.  Wash the wound with soap and water 2 times a day. Rinse the wound off with water to remove all soap. Pat the wound dry with a clean towel.  After cleaning, apply a thin layer of the antibiotic ointment recommended by your health care provider. This will help prevent infection and keep the dressing from sticking.  You may shower as usual after the first 24 hours. Do not soak the wound in water until the sutures are removed.  Get  your sutures removed as directed by your health care provider. With facial lacerations, sutures should be taken out after 4-5 days to avoid stitch marks.  Wait a few days after your sutures are removed before applying any makeup. For skin adhesive strips:  Keep the wound clean and dry.  Do not get the skin adhesive strips wet. You may bathe carefully, using caution to keep the wound dry.  If the wound gets wet, pat it dry with a clean towel.  Skin adhesive strips will fall off on their own. You may trim the strips as the wound heals. Do not remove skin adhesive strips that are still stuck to the wound. With time, they  will fall off on their own. For wound adhesive:  You may briefly wet your wound in the shower or bath: ? Do not soak or scrub the wound. ? Do not swim. ? Avoid periods of heavy sweating until the skin adhesive has fallen off on its own. ? After showering or bathing, gently pat the wound dry with a clean towel.  Do not apply liquid medicine, cream medicine, ointment medicine, or makeup to your wound while the skin adhesive is in place. This may loosen the film before your wound is healed.  If a dressing is placed over the wound, be careful not to apply tape directly over the skin adhesive. This may cause the adhesive to be pulled off before the wound is healed.  Avoid prolonged exposure to sunlight or tanning lamps while the skin adhesive is in place.  The skin adhesive will usually remain in place for 5-10 days, then naturally fall off the skin. Do not pick at the adhesive film. After healing:  Once the wound has healed: ? Cover the wound with sunscreen during the day for 1 full year. This can help minimize scarring. ? Exposure to ultraviolet light in the first year will darken the scar. ? It can take 1-2 years for the scar to lose its redness and to heal completely. Contact a health care provider if:  You have a fever.  You see ayellowish-white fluid (pus) coming  from the wound. Get help right away if:  You have redness, pain, or swelling around the wound. This information is not intended to replace advice given to you by your health care provider. Make sure you discuss any questions you have with your health care provider. Document Released: 12/07/2004 Document Revised: 06/08/2016 Document Reviewed: 06/12/2013 Elsevier Interactive Patient Education  2017 [employer].
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
## 2017-05-23 NOTE — Clinical Social Work Note (Signed)
Clinical Social Worker met with patient and confirmed adequate transportation and housing at discharge.  SBIRT completed, however patient very guarded and forthcoming with any additional information.  Patient did verbalize understanding of the risks associated with excessive drinking.  Patient declined available resources at this time.  Clinical Social Worker will sign off for now as social work intervention is no longer needed. Please consult [place] again if new need arises.  Barbette Or, [place]

## 2017-05-23 NOTE — Progress Notes (Signed)
Subjective: No new complaint. No change in his vision.  Objective: Vital signs in last 24 hours: Temp:  [97.9 F (36.6 C)-99.1 F (37.3 C)] 99.1 F (37.3 C) (07/11 0545) Pulse Rate:  [69-72] 72 (07/11 0545) Resp:  [16-18] 16 (07/11 0545) BP: (121-140)/(75-82) 121/75 (07/11 0545) SpO2:  [98 %-99 %] 99 % (07/11 0545)  Physical Exam Constitutional: He is oriented to person, place, and time. He appears well-developedand well-nourished. No distress.  Head: Normocephalic. Multiple facial lacerations.  Superficial laceration/skin tear near left medial canthus Superficial laceration/skin tear inferior to left eye Superficial nasal bridge laceration Eyes: Conjunctivaeand EOMare normal. Pupils are equal, round, and reactive to light.  Normal eye movements. Swelling around left orbit. Vision grossly intact. Nose: Nasal dorsal edema. No significant deformity. No nasal septal hematomas Ears: Normal auricles and and EACs. Mouth: No laceration/injury, Neck: Normal range of motion.  Cardiovascular: Normal rate, regular rhythm, normal heart soundsand intact distal pulses. Exam reveals no gallopand no friction rub.  Pulmonary/Chest: Effort normaland breath sounds normal. No respiratory distress. Neurological: He is alertand oriented to person, place, and time. He has normal strength. No sensory deficit.  Skin: Skin is warmand dry. He is not diaphoretic.    Recent Labs  05/21/17 1020 05/21/17 1030 05/22/17 0915  WBC 6.6  --  5.3  HGB 17.5* 17.3* 16.0  HCT 49.2 51.0 46.6  PLT 150  --  131*     Assessment/Plan: Facial trauma involving fractures of the left maxillary and orbital walls, and nasal and septal fractures. His facial lacerations/abrasions will not need surgical repair. No orbital symptoms. Pt may follow up with me as an outpatient in 1-2 weeks.   LOS: 0 days   Navpreet Szczygiel W Wiley Flicker 05/23/2017, 9:50 PM

## 2017-05-25 ENCOUNTER — Encounter (HOSPITAL_COMMUNITY): Payer: Self-pay | Admitting: General Surgery

## 2017-05-25 DIAGNOSIS — S0230XA Fracture of orbital floor, unspecified side, initial encounter for closed fracture: Secondary | ICD-10-CM

## 2017-05-25 DIAGNOSIS — S022XXA Fracture of nasal bones, initial encounter for closed fracture: Secondary | ICD-10-CM

## 2017-05-25 DIAGNOSIS — S2249XA Multiple fractures of ribs, unspecified side, initial encounter for closed fracture: Secondary | ICD-10-CM

## 2017-05-25 HISTORY — DX: Fracture of nasal bones, initial encounter for closed fracture: S02.2XXA

## 2017-05-25 HISTORY — DX: Fracture of orbital floor, unspecified side, initial encounter for closed fracture: S02.30XA

## 2017-05-25 HISTORY — DX: Multiple fractures of ribs, unspecified side, initial encounter for closed fracture: S22.49XA

## 2017-05-30 ENCOUNTER — Encounter (INDEPENDENT_AMBULATORY_CARE_PROVIDER_SITE_OTHER): Payer: Self-pay | Admitting: General Surgery
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
